# Patient Record
Sex: Female | Born: 1960 | Race: White | Hispanic: No | Marital: Single | Smoking: Never smoker
Health system: Southern US, Community
[De-identification: ages and names within clinical notes are randomized; demographics above are authoritative.]

## PROBLEM LIST (undated history)

## (undated) DIAGNOSIS — E7800 Pure hypercholesterolemia, unspecified: Secondary | ICD-10-CM

## (undated) DIAGNOSIS — R011 Cardiac murmur, unspecified: Secondary | ICD-10-CM

## (undated) DIAGNOSIS — Z98811 Dental restoration status: Secondary | ICD-10-CM

## (undated) DIAGNOSIS — N6310 Unspecified lump in the right breast, unspecified quadrant: Secondary | ICD-10-CM

## (undated) DIAGNOSIS — J45998 Other asthma: Secondary | ICD-10-CM

---

## 2009-10-21 HISTORY — PX: ABDOMINAL HYSTERECTOMY: SHX81

## 2013-03-16 DIAGNOSIS — E669 Obesity, unspecified: Secondary | ICD-10-CM | POA: Insufficient documentation

## 2013-03-16 DIAGNOSIS — E7800 Pure hypercholesterolemia, unspecified: Secondary | ICD-10-CM | POA: Insufficient documentation

## 2013-05-28 DIAGNOSIS — M25571 Pain in right ankle and joints of right foot: Secondary | ICD-10-CM | POA: Insufficient documentation

## 2013-05-28 DIAGNOSIS — M79671 Pain in right foot: Secondary | ICD-10-CM | POA: Insufficient documentation

## 2013-08-05 DIAGNOSIS — M7989 Other specified soft tissue disorders: Secondary | ICD-10-CM | POA: Insufficient documentation

## 2013-08-05 DIAGNOSIS — M2011 Hallux valgus (acquired), right foot: Secondary | ICD-10-CM | POA: Insufficient documentation

## 2013-10-25 DIAGNOSIS — Z9109 Other allergy status, other than to drugs and biological substances: Secondary | ICD-10-CM | POA: Insufficient documentation

## 2013-12-17 HISTORY — PX: COLONOSCOPY: SHX174

## 2013-12-17 LAB — HM COLONOSCOPY

## 2016-02-01 ENCOUNTER — Encounter: Payer: 59 | Attending: Family Medicine | Admitting: Dietician

## 2016-02-01 ENCOUNTER — Encounter: Payer: Self-pay | Admitting: Dietician

## 2016-02-01 VITALS — Ht 65.5 in | Wt 184.6 lb

## 2016-02-01 DIAGNOSIS — E669 Obesity, unspecified: Secondary | ICD-10-CM

## 2016-02-01 DIAGNOSIS — E785 Hyperlipidemia, unspecified: Secondary | ICD-10-CM | POA: Diagnosis not present

## 2016-02-01 DIAGNOSIS — Z0000 Encounter for general adult medical examination without abnormal findings: Secondary | ICD-10-CM | POA: Diagnosis not present

## 2016-02-01 DIAGNOSIS — J45909 Unspecified asthma, uncomplicated: Secondary | ICD-10-CM | POA: Diagnosis not present

## 2016-02-01 DIAGNOSIS — Z713 Dietary counseling and surveillance: Secondary | ICD-10-CM | POA: Insufficient documentation

## 2016-02-01 DIAGNOSIS — Z79899 Other long term (current) drug therapy: Secondary | ICD-10-CM | POA: Diagnosis not present

## 2016-02-01 DIAGNOSIS — Z882 Allergy status to sulfonamides status: Secondary | ICD-10-CM | POA: Diagnosis not present

## 2016-02-01 NOTE — Progress Notes (Signed)
  Medical Nutrition Therapy:  Appt start time: 1125 end time:  1210.   Assessment:  Primary concerns today: Angela Barajas is here today since she is gaining weight. Diabetes and high cholesterol runs in her family. Tries to manage cholesterol with supplements since Lipitor made her legs ache. Had blood work done today. Started gaining weight in November when she started working at [employer] (between 12-15 lbs). Usual body weight is 155-170 lbs.   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 Feels like she is eating on the go. Has a 45-60 minute commute. Needs to know what kind of snack foods are good to eat. Does not miss or skip meals. Tries to not eat past 8 PM. Eats out as little as possible (tries to limit preservatives and sodium). Will cook meals ahead and freeze them.   Was snacking on muffins and other snacks when she was training for her job. Trying to cut back on carbs (cereal) and snacks in the last 2 weeks. Hasn't been walking for exercise lately but used to at her old job. Also knee is bothering her (arthritis).   Tries to drink more water than she used to. Cutting back on snacks.   Preferred Learning Style:   No preference indicated   Learning Readiness:   Ready  MEDICATIONS: see list   DIETARY INTAKE:  Usual eating pattern includes 3 meals and 0-1 snacks per day.  Avoided foods include: celery, cooked cabbage  24-hr recall:  B ( AM): egg and bacon or cereal (cheerios) and milk or low carb breakfast bar  Snk ( AM): none L ( PM): salad with boiled egg, nuts, sometimes fruit or lima or pork and beans or subway (chicken and bacon 6 in sub) or Malaw[place]urkey sandwich with cheese and sometimes with chips  Snk ( PM): none  D ( PM): broiled chicken or halibut/fish with green beans or potatoes/rice or shrimp and grits Snk ( PM): ice cream popsicle sometime Beverages: coffee with splenda and creamer, diet soda, water  Usual physical activity: exercise bike 15  minutes 2 x week  Estimated energy needs: 1600 calories 180 g carbohydrates 120 g protein 44 g fat  Progress Towards Goal(s):  In progress.   Nutritional Diagnosis:  Cowles-3.3 Overweight/obesity As related to hx of excess consumption of carbohydrates and unstructured meals and snacks.  As evidenced by BMI of 30.3.    Intervention:  Nutrition counseling provided. Plan: Aim to fill half of your plate with vegetables at lunch and dinner. Have protein the size of the palm of your hands Limit starches/carbs to a quarter of your plate. Have carb and protein together at breakfast and snacks. Try [name] cereal. Recommendation for exercise is 30 minutes 5 x week (150 minutes) Look to add strength training exercise. (look into trainer/classes through [employer])  Teaching Method Utilized:  Visual Auditory Hands on  Handouts given during visit include:  MyPlate Handout  15 g CHO Snacks  Barriers to learning/adherence to lifestyle change: busy schedule  Demonstrated degree of understanding via:  Teach Back   Monitoring/Evaluation:  Dietary intake, exercise, and body weight in 1 month(s).

## 2016-02-01 NOTE — Patient Instructions (Addendum)
Aim to fill half of your plate with vegetables at lunch and dinner. Have protein the size of the palm of your hands Limit starches/carbs to a quarter of your plate. Have carb and protein together at breakfast and snacks. Try Angela Barajas cereal. Recommendation for exercise is 30 minutes 5 x week (150 minutes) Look to add strength training exercise. (look into trainer/classes through [employer])

## 2016-02-05 MED FILL — VENTOLIN HFA 90 MCG INHALER: 108 (90 BAS | 25 days supply | Qty: 18 | Fill #0

## 2016-02-05 MED FILL — DYMISTA NASAL SPRAY: 137-50 | 30 days supply | Qty: 23 | Fill #0

## 2016-02-05 MED FILL — MONTELUKAST SOD 10 MG TAB: 10 | 90 days supply | Qty: 90 | Fill #0

## 2016-03-11 ENCOUNTER — Ambulatory Visit: Payer: Self-pay | Admitting: Dietician

## 2016-03-15 DIAGNOSIS — Z1231 Encounter for screening mammogram for malignant neoplasm of breast: Secondary | ICD-10-CM | POA: Diagnosis not present

## 2016-04-15 DIAGNOSIS — N6001 Solitary cyst of right breast: Secondary | ICD-10-CM | POA: Diagnosis not present

## 2016-04-15 LAB — HM MAMMOGRAPHY

## 2016-05-09 DIAGNOSIS — R928 Other abnormal and inconclusive findings on diagnostic imaging of breast: Secondary | ICD-10-CM | POA: Diagnosis not present

## 2016-05-09 DIAGNOSIS — N6011 Diffuse cystic mastopathy of right breast: Secondary | ICD-10-CM | POA: Diagnosis not present

## 2016-06-17 DIAGNOSIS — R928 Other abnormal and inconclusive findings on diagnostic imaging of breast: Secondary | ICD-10-CM | POA: Diagnosis not present

## 2016-07-12 ENCOUNTER — Ambulatory Visit: Payer: Self-pay | Admitting: General Surgery

## 2016-07-12 DIAGNOSIS — R928 Other abnormal and inconclusive findings on diagnostic imaging of breast: Secondary | ICD-10-CM

## 2016-07-21 DIAGNOSIS — N6310 Unspecified lump in the right breast, unspecified quadrant: Secondary | ICD-10-CM

## 2016-07-21 HISTORY — DX: Unspecified lump in the right breast, unspecified quadrant: N63.10

## 2016-07-24 ENCOUNTER — Other Ambulatory Visit: Payer: Self-pay | Admitting: General Surgery

## 2016-07-24 DIAGNOSIS — R928 Other abnormal and inconclusive findings on diagnostic imaging of breast: Secondary | ICD-10-CM

## 2016-07-25 ENCOUNTER — Encounter (HOSPITAL_BASED_OUTPATIENT_CLINIC_OR_DEPARTMENT_OTHER): Payer: Self-pay | Admitting: *Deleted

## 2016-08-07 DIAGNOSIS — R928 Other abnormal and inconclusive findings on diagnostic imaging of breast: Secondary | ICD-10-CM | POA: Diagnosis not present

## 2016-08-31 MED FILL — VENTOLIN HFA 90 MCG INHALER: 108 (90 BAS | 25 days supply | Qty: 18 | Fill #1

## 2016-09-02 MED FILL — MONTELUKAST SOD 10 MG TAB: 10 | 90 days supply | Qty: 90 | Fill #1

## 2016-09-02 MED FILL — DYMISTA NASAL SPRAY: 137-50 | 30 days supply | Qty: 23 | Fill #1

## 2016-09-17 ENCOUNTER — Encounter (HOSPITAL_COMMUNITY): Payer: Self-pay | Admitting: *Deleted

## 2016-09-20 ENCOUNTER — Ambulatory Visit
Admission: RE | Admit: 2016-09-20 | Discharge: 2016-09-20 | Disposition: A | Discharge status: Home / Self Care | Payer: 59 | Source: Ambulatory Visit | Attending: General Surgery | Admitting: General Surgery

## 2016-09-20 DIAGNOSIS — N6489 Other specified disorders of breast: Secondary | ICD-10-CM | POA: Diagnosis not present

## 2016-09-20 DIAGNOSIS — R928 Other abnormal and inconclusive findings on diagnostic imaging of breast: Secondary | ICD-10-CM

## 2016-09-20 NOTE — Progress Notes (Signed)
Pt given 8 oz carton of boost breeze to drink by 430 am morning of surgery given written instruction and verbal , teach back ,  Pt voiced understanding

## 2016-09-23 ENCOUNTER — Encounter (HOSPITAL_BASED_OUTPATIENT_CLINIC_OR_DEPARTMENT_OTHER)
Admission: RE | Disposition: A | Discharge status: Home / Self Care | Payer: Self-pay | Source: Ambulatory Visit | Attending: General Surgery

## 2016-09-23 ENCOUNTER — Encounter (HOSPITAL_BASED_OUTPATIENT_CLINIC_OR_DEPARTMENT_OTHER): Payer: Self-pay

## 2016-09-23 ENCOUNTER — Ambulatory Visit (HOSPITAL_BASED_OUTPATIENT_CLINIC_OR_DEPARTMENT_OTHER): Payer: 59 | Admitting: Anesthesiology

## 2016-09-23 ENCOUNTER — Ambulatory Visit (HOSPITAL_COMMUNITY)
Admission: RE | Admit: 2016-09-23 | Discharge: 2016-09-23 | Disposition: A | Discharge status: Home / Self Care | Payer: 59 | Source: Ambulatory Visit | Attending: General Surgery | Admitting: General Surgery

## 2016-09-23 ENCOUNTER — Ambulatory Visit
Admission: RE | Admit: 2016-09-23 | Discharge: 2016-09-23 | Disposition: A | Discharge status: Home / Self Care | Payer: 59 | Source: Ambulatory Visit | Attending: General Surgery | Admitting: General Surgery

## 2016-09-23 DIAGNOSIS — Z9889 Other specified postprocedural states: Secondary | ICD-10-CM | POA: Diagnosis not present

## 2016-09-23 DIAGNOSIS — R928 Other abnormal and inconclusive findings on diagnostic imaging of breast: Secondary | ICD-10-CM

## 2016-09-23 DIAGNOSIS — Z79899 Other long term (current) drug therapy: Secondary | ICD-10-CM | POA: Diagnosis not present

## 2016-09-23 DIAGNOSIS — N6489 Other specified disorders of breast: Secondary | ICD-10-CM | POA: Diagnosis not present

## 2016-09-23 DIAGNOSIS — N6011 Diffuse cystic mastopathy of right breast: Secondary | ICD-10-CM | POA: Diagnosis not present

## 2016-09-23 DIAGNOSIS — Z882 Allergy status to sulfonamides status: Secondary | ICD-10-CM | POA: Diagnosis not present

## 2016-09-23 DIAGNOSIS — Z90710 Acquired absence of both cervix and uterus: Secondary | ICD-10-CM | POA: Insufficient documentation

## 2016-09-23 DIAGNOSIS — J45909 Unspecified asthma, uncomplicated: Secondary | ICD-10-CM | POA: Insufficient documentation

## 2016-09-23 DIAGNOSIS — Z833 Family history of diabetes mellitus: Secondary | ICD-10-CM | POA: Insufficient documentation

## 2016-09-23 DIAGNOSIS — N6310 Unspecified lump in the right breast, unspecified quadrant: Secondary | ICD-10-CM | POA: Diagnosis not present

## 2016-09-23 DIAGNOSIS — N63 Unspecified lump in unspecified breast: Secondary | ICD-10-CM | POA: Diagnosis present

## 2016-09-23 DIAGNOSIS — Z8249 Family history of ischemic heart disease and other diseases of the circulatory system: Secondary | ICD-10-CM | POA: Insufficient documentation

## 2016-09-23 HISTORY — DX: Unspecified lump in the right breast, unspecified quadrant: N63.10

## 2016-09-23 HISTORY — DX: Dental restoration status: Z98.811

## 2016-09-23 HISTORY — DX: Pure hypercholesterolemia, unspecified: E78.00

## 2016-09-23 HISTORY — PX: RADIOACTIVE SEED GUIDED EXCISIONAL BREAST BIOPSY: SHX6490

## 2016-09-23 HISTORY — DX: Cardiac murmur, unspecified: R01.1

## 2016-09-23 HISTORY — DX: Other asthma: J45.998

## 2016-09-23 SURGERY — RADIOACTIVE SEED GUIDED BREAST BIOPSY
Anesthesia: General | Site: Breast | Laterality: Right

## 2016-09-23 MED ORDER — EPHEDRINE SULFATE 50 MG/ML IJ SOLN
INTRAMUSCULAR | Status: DC | PRN
  Administered 2016-09-23: 10 mg via INTRAVENOUS

## 2016-09-23 MED ORDER — CEFAZOLIN SODIUM-DEXTROSE 2-4 GM/100ML-% IV SOLN
2.0000 g | INTRAVENOUS | Status: AC
  Administered 2016-09-23: 2 g via INTRAVENOUS

## 2016-09-23 MED ORDER — PROMETHAZINE HCL 25 MG/ML IJ SOLN
INTRAMUSCULAR | Status: AC
  Filled 2016-09-23: qty 1

## 2016-09-23 MED ORDER — LIDOCAINE HCL (CARDIAC) 20 MG/ML IV SOLN
INTRAVENOUS | Status: DC | PRN
  Administered 2016-09-23: 30 mg via INTRAVENOUS

## 2016-09-23 MED ORDER — HYDROMORPHONE HCL 1 MG/ML IJ SOLN
0.2500 mg | INTRAMUSCULAR | Status: DC | PRN
  Administered 2016-09-23: 0.5 mg via INTRAVENOUS

## 2016-09-23 MED ORDER — FENTANYL CITRATE (PF) 100 MCG/2ML IJ SOLN
INTRAMUSCULAR | Status: AC
  Filled 2016-09-23: qty 2

## 2016-09-23 MED ORDER — ACETAMINOPHEN 500 MG PO TABS
1000.0000 mg | ORAL_TABLET | ORAL | Status: AC
  Administered 2016-09-23: 1000 mg via ORAL

## 2016-09-23 MED ORDER — MIDAZOLAM HCL 2 MG/2ML IJ SOLN
INTRAMUSCULAR | Status: AC
  Filled 2016-09-23: qty 2

## 2016-09-23 MED ORDER — PHENYLEPHRINE 40 MCG/ML (10ML) SYRINGE FOR IV PUSH (FOR BLOOD PRESSURE SUPPORT)
PREFILLED_SYRINGE | INTRAVENOUS | Status: AC
  Filled 2016-09-23: qty 10

## 2016-09-23 MED ORDER — HYDROMORPHONE HCL 1 MG/ML IJ SOLN
INTRAMUSCULAR | Status: AC
  Filled 2016-09-23: qty 1

## 2016-09-23 MED ORDER — PROPOFOL 10 MG/ML IV BOLUS
INTRAVENOUS | Status: AC
  Filled 2016-09-23: qty 20

## 2016-09-23 MED ORDER — LACTATED RINGERS IV SOLN
INTRAVENOUS | Status: DC
  Administered 2016-09-23 (×2): via INTRAVENOUS

## 2016-09-23 MED ORDER — GLYCOPYRROLATE 0.2 MG/ML IV SOSY
PREFILLED_SYRINGE | INTRAVENOUS | Status: AC
  Filled 2016-09-23: qty 3

## 2016-09-23 MED ORDER — ACETAMINOPHEN 500 MG PO TABS
ORAL_TABLET | ORAL | Status: AC
  Filled 2016-09-23: qty 2

## 2016-09-23 MED ORDER — DEXAMETHASONE SODIUM PHOSPHATE 10 MG/ML IJ SOLN
INTRAMUSCULAR | Status: AC
  Filled 2016-09-23: qty 1

## 2016-09-23 MED ORDER — FENTANYL CITRATE (PF) 100 MCG/2ML IJ SOLN: 50.0000 ug | INTRAMUSCULAR | Status: DC | PRN

## 2016-09-23 MED ORDER — ONDANSETRON HCL 4 MG/2ML IJ SOLN
INTRAMUSCULAR | Status: AC
  Filled 2016-09-23: qty 2

## 2016-09-23 MED ORDER — LIDOCAINE 2% (20 MG/ML) 5 ML SYRINGE
INTRAMUSCULAR | Status: AC
  Filled 2016-09-23: qty 5

## 2016-09-23 MED ORDER — BUPIVACAINE HCL (PF) 0.25 % IJ SOLN
INTRAMUSCULAR | Status: DC | PRN
  Administered 2016-09-23: 15 mL

## 2016-09-23 MED ORDER — HYDROCODONE-ACETAMINOPHEN 10-325 MG PO TABS: 1.0000 | ORAL_TABLET | Freq: Four times a day (QID) | ORAL | 0 refills | Status: DC | PRN

## 2016-09-23 MED ORDER — EPHEDRINE 5 MG/ML INJ
INTRAVENOUS | Status: AC
  Filled 2016-09-23: qty 10

## 2016-09-23 MED ORDER — GABAPENTIN 300 MG PO CAPS
300.0000 mg | ORAL_CAPSULE | ORAL | Status: AC
  Administered 2016-09-23: 300 mg via ORAL

## 2016-09-23 MED ORDER — PROPOFOL 10 MG/ML IV BOLUS
INTRAVENOUS | Status: DC | PRN
  Administered 2016-09-23: 200 mg via INTRAVENOUS

## 2016-09-23 MED ORDER — CEFAZOLIN SODIUM-DEXTROSE 2-4 GM/100ML-% IV SOLN
INTRAVENOUS | Status: AC
  Filled 2016-09-23: qty 100

## 2016-09-23 MED ORDER — GLYCOPYRROLATE 0.2 MG/ML IJ SOLN
0.2000 mg | Freq: Once | INTRAMUSCULAR | Status: AC | PRN
  Administered 2016-09-23 (×2): 0.2 mg via INTRAVENOUS

## 2016-09-23 MED ORDER — FENTANYL CITRATE (PF) 100 MCG/2ML IJ SOLN
INTRAMUSCULAR | Status: DC | PRN
  Administered 2016-09-23: 100 ug via INTRAVENOUS

## 2016-09-23 MED ORDER — SCOPOLAMINE 1 MG/3DAYS TD PT72: 1.0000 | MEDICATED_PATCH | Freq: Once | TRANSDERMAL | Status: DC | PRN

## 2016-09-23 MED ORDER — KETOROLAC TROMETHAMINE 30 MG/ML IJ SOLN: 30.0000 mg | Freq: Once | INTRAMUSCULAR | Status: DC | PRN

## 2016-09-23 MED ORDER — PROMETHAZINE HCL 25 MG/ML IJ SOLN
6.2500 mg | INTRAMUSCULAR | Status: DC | PRN
  Administered 2016-09-23: 6.25 mg via INTRAVENOUS

## 2016-09-23 MED ORDER — MIDAZOLAM HCL 2 MG/2ML IJ SOLN: 1.0000 mg | INTRAMUSCULAR | Status: DC | PRN

## 2016-09-23 MED ORDER — PHENYLEPHRINE HCL 10 MG/ML IJ SOLN
INTRAMUSCULAR | Status: DC | PRN
  Administered 2016-09-23 (×3): 80 ug via INTRAVENOUS

## 2016-09-23 MED ORDER — GABAPENTIN 300 MG PO CAPS
ORAL_CAPSULE | ORAL | Status: AC
  Filled 2016-09-23: qty 1

## 2016-09-23 MED ORDER — DEXAMETHASONE SODIUM PHOSPHATE 4 MG/ML IJ SOLN
INTRAMUSCULAR | Status: DC | PRN
  Administered 2016-09-23: 10 mg via INTRAVENOUS

## 2016-09-23 MED ORDER — BUPIVACAINE HCL (PF) 0.25 % IJ SOLN
INTRAMUSCULAR | Status: AC
  Filled 2016-09-23: qty 30

## 2016-09-23 SURGICAL SUPPLY — 52 items
BINDER BREAST LRG (GAUZE/BANDAGES/DRESSINGS) IMPLANT
BINDER BREAST MEDIUM (GAUZE/BANDAGES/DRESSINGS) IMPLANT
BINDER BREAST XLRG (GAUZE/BANDAGES/DRESSINGS) ×2 IMPLANT
BINDER BREAST XXLRG (GAUZE/BANDAGES/DRESSINGS) IMPLANT
BLADE SURG 15 STRL LF DISP TIS (BLADE) ×1 IMPLANT
BLADE SURG 15 STRL SS (BLADE) ×1
CANISTER SUC SOCK COL 7IN (MISCELLANEOUS) IMPLANT
CANISTER SUCT 1200ML W/VALVE (MISCELLANEOUS) IMPLANT
CHLORAPREP W/TINT 26ML (MISCELLANEOUS) ×2 IMPLANT
CLIP TI WIDE RED SMALL 6 (CLIP) ×2 IMPLANT
COVER BACK TABLE 60X90IN (DRAPES) ×2 IMPLANT
COVER MAYO STAND STRL (DRAPES) ×2 IMPLANT
COVER PROBE W GEL 5X96 (DRAPES) ×2 IMPLANT
DECANTER SPIKE VIAL GLASS SM (MISCELLANEOUS) IMPLANT
DERMABOND ADVANCED (GAUZE/BANDAGES/DRESSINGS) ×1
DERMABOND ADVANCED .7 DNX12 (GAUZE/BANDAGES/DRESSINGS) ×1 IMPLANT
DEVICE DUBIN W/COMP PLATE 8390 (MISCELLANEOUS) ×2 IMPLANT
DRAPE LAPAROSCOPIC ABDOMINAL (DRAPES) ×2 IMPLANT
DRAPE UTILITY XL STRL (DRAPES) ×2 IMPLANT
DRSG TEGADERM 4X4.75 (GAUZE/BANDAGES/DRESSINGS) IMPLANT
ELECT COATED BLADE 2.86 ST (ELECTRODE) ×2 IMPLANT
ELECT REM PT RETURN 9FT ADLT (ELECTROSURGICAL) ×2
ELECTRODE REM PT RTRN 9FT ADLT (ELECTROSURGICAL) ×1 IMPLANT
GLOVE BIO SURGEON STRL SZ7 (GLOVE) ×6 IMPLANT
GLOVE BIOGEL PI IND STRL 7.5 (GLOVE) ×2 IMPLANT
GLOVE BIOGEL PI INDICATOR 7.5 (GLOVE) ×2
GOWN STRL REUS W/ TWL LRG LVL3 (GOWN DISPOSABLE) ×2 IMPLANT
GOWN STRL REUS W/TWL LRG LVL3 (GOWN DISPOSABLE) ×2
ILLUMINATOR WAVEGUIDE N/F (MISCELLANEOUS) ×2 IMPLANT
KIT MARKER MARGIN INK (KITS) ×2 IMPLANT
LIGHT WAVEGUIDE WIDE FLAT (MISCELLANEOUS) IMPLANT
NEEDLE HYPO 25X1 1.5 SAFETY (NEEDLE) ×2 IMPLANT
NS IRRIG 1000ML POUR BTL (IV SOLUTION) ×2 IMPLANT
PACK BASIN DAY SURGERY FS (CUSTOM PROCEDURE TRAY) ×2 IMPLANT
PENCIL BUTTON HOLSTER BLD 10FT (ELECTRODE) ×2 IMPLANT
SLEEVE SCD COMPRESS KNEE MED (MISCELLANEOUS) ×2 IMPLANT
SPONGE GAUZE 4X4 12PLY STER LF (GAUZE/BANDAGES/DRESSINGS) IMPLANT
SPONGE LAP 4X18 X RAY DECT (DISPOSABLE) ×2 IMPLANT
STRIP CLOSURE SKIN 1/2X4 (GAUZE/BANDAGES/DRESSINGS) ×2 IMPLANT
SUT MNCRL AB 4-0 PS2 18 (SUTURE) ×2 IMPLANT
SUT MON AB 5-0 PS2 18 (SUTURE) IMPLANT
SUT SILK 2 0 SH (SUTURE) IMPLANT
SUT VIC AB 2-0 SH 27 (SUTURE) ×1
SUT VIC AB 2-0 SH 27XBRD (SUTURE) ×1 IMPLANT
SUT VIC AB 3-0 SH 27 (SUTURE) ×1
SUT VIC AB 3-0 SH 27X BRD (SUTURE) ×1 IMPLANT
SUT VIC AB 5-0 PS2 18 (SUTURE) IMPLANT
SYR CONTROL 10ML LL (SYRINGE) ×2 IMPLANT
TOWEL OR 17X24 6PK STRL BLUE (TOWEL DISPOSABLE) ×2 IMPLANT
TOWEL OR NON WOVEN STRL DISP B (DISPOSABLE) ×2 IMPLANT
TUBE CONNECTING 20X1/4 (TUBING) IMPLANT
YANKAUER SUCT BULB TIP NO VENT (SUCTIONS) IMPLANT

## 2016-09-23 NOTE — Discharge Instructions (Signed)
[facility] °Office Phone Number [phone] ° °POST OP INSTRUCTIONS ° °Always review your discharge instruction sheet given to you by the facility where your surgery was performed. ° °IF YOU HAVE DISABILITY OR FAMILY LEAVE FORMS, YOU MUST BRING THEM TO THE OFFICE FOR PROCESSING.  DO NOT GIVE THEM TO YOUR DOCTOR. ° °1. A prescription for pain medication may be given to you upon discharge.  Take your pain medication as prescribed, if needed.  If narcotic pain medicine is not needed, then you may take acetaminophen (Tylenol), naprosyn (Alleve) or ibuprofen (Advil) as needed. °2. Take your usually prescribed medications unless otherwise directed °3. If you need a refill on your pain medication, please contact your pharmacy.  They will contact our office to request authorization.  Prescriptions will not be filled after 5pm or on week-ends. °4. You should eat very light the first 24 hours after surgery, such as soup, crackers, pudding, etc.  Resume your normal diet the day after surgery. °5. Most patients will experience some swelling and bruising in the breast.  Ice packs and a good support bra will help.  Wear the breast binder provided or a sports bra for 72 hours day and night.  After that wear a sports bra during the day until you return to the office. Swelling and bruising can take several days to resolve.  °6. It is common to experience some constipation if taking pain medication after surgery.  Increasing fluid intake and taking a stool softener will usually help or prevent this problem from occurring.  A mild laxative (Milk of Magnesia or Miralax) should be taken according to package directions if there are no bowel movements after 48 hours. °7. Unless discharge instructions indicate otherwise, you may remove your bandages 48 hours after surgery and you may shower at that time.  You may have steri-strips (small skin tapes) in place directly over the incision.  These strips should be left on the  skin for 7-10 days and will come off on their own.  If your surgeon used skin glue on the incision, you may shower in 24 hours.  The glue will flake off over the next 2-3 weeks.  Any sutures or staples will be removed at the office during your follow-up visit. °8. ACTIVITIES:  You may resume regular daily activities (gradually increasing) beginning the next day.  Wearing a good support bra or sports bra minimizes pain and swelling.  You may have sexual intercourse when it is comfortable. °a. You may drive when you no longer are taking prescription pain medication, you can comfortably wear a seatbelt, and you can safely maneuver your car and apply brakes. °b. RETURN TO WORK:  ______________________________________________________________________________________ °9. You should see your doctor in the office for a follow-up appointment approximately two weeks after your surgery.  Your doctor’s nurse will typically make your follow-up appointment when she calls you with your pathology report.  Expect your pathology report 3-4 business days after your surgery.  You may call to check if you do not hear from us after three days. °10. OTHER INSTRUCTIONS: _______________________________________________________________________________________________ _____________________________________________________________________________________________________________________________________ °_____________________________________________________________________________________________________________________________________ °_____________________________________________________________________________________________________________________________________ ° °WHEN TO CALL DR WAKEFIELD: °1. Fever over 101.0 °2. Nausea and/or vomiting. °3. Extreme swelling or bruising. °4. Continued bleeding from incision. °5. Increased pain, redness, or drainage from the incision. ° °The clinic staff is available to answer your questions during regular  business hours.  Please don’t hesitate to call and ask to speak to one of the nurses for clinical concerns.  If   you have a medical emergency, go to the nearest emergency room or call 911.  A surgeon from [facility] is always on call at the hospital. ° °For further questions, please visit [email or web address] ° ° ° °Post Anesthesia Home Care Instructions ° °Activity: °Get plenty of rest for the remainder of the day. A responsible adult should stay with you for 24 hours following the procedure.  °For the next 24 hours, DO NOT: °-Drive a car °-Operate machinery °-Drink alcoholic beverages °-Take any medication unless instructed by your physician °-Make any legal decisions or sign important papers. ° °Meals: °Start with liquid foods such as gelatin or soup. Progress to regular foods as tolerated. Avoid greasy, spicy, heavy foods. If nausea and/or vomiting occur, drink only clear liquids until the nausea and/or vomiting subsides. Call your physician if vomiting continues. ° °Special Instructions/Symptoms: °Your throat may feel dry or sore from the anesthesia or the breathing tube placed in your throat during surgery. If this causes discomfort, gargle with warm salt water. The discomfort should disappear within 24 hours. ° °If you had a scopolamine patch placed behind your ear for the management of post- operative nausea and/or vomiting: ° °1. The medication in the patch is effective for 72 hours, after which it should be removed.  Wrap patch in a tissue and discard in the trash. Wash hands thoroughly with soap and water. °2. You may remove the patch earlier than 72 hours if you experience unpleasant side effects which may include dry mouth, dizziness or visual disturbances. °3. Avoid touching the patch. Wash your hands with soap and water after contact with the patch. °  ° °

## 2016-09-23 NOTE — Anesthesia Postprocedure Evaluation (Signed)
Anesthesia Post Note  Patient: Angela Barajas  Procedure(s) Performed: Procedure(s) (LRB): RADIOACTIVE SEED GUIDED EXCISIONAL RIGHT BREAST BIOPSY (Right)  Patient location during evaluation: PACU Anesthesia Type: General Level of consciousness: awake and alert Pain management: pain level controlled Vital Signs Assessment: post-procedure vital signs reviewed and stable Respiratory status: spontaneous breathing, nonlabored ventilation, respiratory function stable and patient connected to nasal cannula oxygen Cardiovascular status: blood pressure returned to baseline and stable Postop Assessment: no signs of nausea or vomiting Anesthetic complications: no    Last Vitals:  Vitals:   09/23/16 0900 09/23/16 0915  BP: 116/72 (P) 120/65  Pulse: 69 (P) 65  Resp: 19 (P) 16  Temp:      Last Pain:  Vitals:   09/23/16 0900  TempSrc:   PainSc: 3                  Kennieth RadFitzgerald, Bucky Grigg E

## 2016-09-23 NOTE — H&P (Signed)
  Angela Barajas who has no family history and no real personal history presents after undergoing screening mm that shows possible architectural distortion in right breast, left breast is negative. she underwent mag views and this persists. on us there is a small cyst and this lesion doesnt really show up. she underwent 3d guided biopsy of this birads four lesion. that is benign. 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    Other Problems  Asthma Heart murmur Hypercholesterolemia  Past Surgical History Hysterectomy (not due to cancer) - Complete  Diagnostic Studies History  Colonoscopy within last year Mammogram within last year Pap Smear >5 years ago  Allergies Sulfur *CHEMICALS*  Medication History  Red Yeast Rice (600MG  Capsule, Oral) Active. Omega 3 (1000MG  Capsule, Oral) Active. Psyllium (0.52GM Capsule, Oral) Active. Calcium "900" w/D (Oral) Active. Cyanocobalamin (1000MCG/ML Solution, Injection) Active. Medications Reconciled  Social History  Caffeine use Carbonated beverages, Coffee, Tea. No alcohol use No drug use Tobacco use Never smoker.  Family History  Diabetes Mellitus Mother. Hypertension Father, Mother.  Pregnancy / Birth History  Gravida 0 Para 0  Review of Systems General Present- Weight Gain. Not Present- Appetite Loss, Chills, Fatigue, Fever, Night Sweats and Weight Loss. HEENT Present- Seasonal Allergies and Wears glasses/contact lenses. Not Present- Earache, Hearing Loss, Hoarseness, Nose Bleed, Oral Ulcers, Ringing in the Ears, Sinus Pain, Sore Throat, Visual Disturbances and Yellow Eyes. Respiratory Not Present- Bloody sputum, Chronic Cough, Difficulty Breathing, Snoring and Wheezing. Breast Present- Breast Mass. Not Present- Breast Pain, Nipple Discharge and Skin Changes. Cardiovascular Not Present- Chest Pain, Difficulty Breathing Lying Down, Leg Cramps,  Palpitations, Rapid Heart Rate, Shortness of Breath and Swelling of Extremities. Gastrointestinal Not Present- Abdominal Pain, Bloating, Bloody Stool, Change in Bowel Habits, Chronic diarrhea, Constipation, Difficulty Swallowing, Excessive gas, Gets full quickly at meals, Hemorrhoids, Indigestion, Nausea, Rectal Pain and Vomiting. Female Genitourinary Not Present- Frequency, Nocturia, Painful Urination, Pelvic Pain and Urgency. Musculoskeletal Not Present- Back Pain, Joint Pain, Joint Stiffness, Muscle Pain, Muscle Weakness and Swelling of Extremities. Neurological Not Present- Decreased Memory, Fainting, Headaches, Numbness, Seizures, Tingling, Tremor, Trouble walking and Weakness. Psychiatric Not Present- Anxiety, Bipolar, Change in Sleep Pattern, Depression, Fearful and Frequent crying. Endocrine Not Present- Cold Intolerance, Excessive Hunger, Hair Changes, Heat Intolerance, Hot flashes and New Diabetes. Hematology Not Present- Blood Thinners, Easy Bruising, Excessive bleeding, Gland problems, HIV and Persistent Infections.  Vitals  Weight: 184 lb Height: 66in Body Surface Area: 1.93 m Body Mass Index: 29.7 kg/m  Pulse: 81 (Regular)  BP: 124/82 (Sitting, Left Arm, Standard) Physical Exam  General Mental Status-Alert. Orientation-Oriented X3. Breast Nipples-No Discharge. Breast Lump-No Palpable Breast Mass. Lymphatic Head & Neck General Head & Neck Lymphatics: Bilateral - Description - Normal. Axillary General Axillary Region: Bilateral - Description - Normal. Note: no [place] adenopathy  Assessment & Plan  MAMMOGRAM ABNORMAL (R92.8) Story: Right breast seed guided excisional biopsy we discussed seed placement, surgery and recovery again today. she is agreeable and will proceed

## 2016-09-23 NOTE — Transfer of Care (Signed)
Immediate Anesthesia Transfer of Care Note  Patient: Angela Barajas  Procedure(s) Performed: Procedure(s): RADIOACTIVE SEED GUIDED EXCISIONAL RIGHT BREAST BIOPSY (Right)  Patient Location: PACU  Anesthesia Type:General  Level of Consciousness: sedated  Airway & Oxygen Therapy: Patient Spontanous Breathing and Patient connected to face mask oxygen  Post-op Assessment: Report given to RN and Post -op Vital signs reviewed and stable  Post vital signs: Reviewed and stable  Last Vitals:  Vitals:   09/23/16 0644  BP: 113/62  Pulse: (!) 58  Resp: 18  Temp: 36.6 C    Last Pain:  Vitals:   09/23/16 0644  TempSrc: Oral         Complications: No apparent anesthesia complications

## 2016-09-23 NOTE — Anesthesia Procedure Notes (Signed)
Procedure Name: LMA Insertion Date/Time: 09/23/2016 7:35 AM Performed by: Angela NorlanderLINKA, Angela Barajas Pre-anesthesia Checklist: Patient identified, Emergency Drugs available, Suction available, Patient being monitored and Timeout performed Patient Re-evaluated:Patient Re-evaluated prior to inductionOxygen Delivery Method: Circle system utilized Preoxygenation: Pre-oxygenation with 100% oxygen Intubation Type: IV induction Ventilation: Mask ventilation without difficulty LMA: LMA inserted LMA Size: 4.0 Number of attempts: 1 Airway Equipment and Method: Bite block Placement Confirmation: positive ETCO2 Tube secured with: Tape Dental Injury: Teeth and Oropharynx as per pre-operative assessment

## 2016-09-23 NOTE — Op Note (Addendum)
Preoperative diagnoses: Right breast mammographic lesion Postoperative diagnosis: Same as above Procedure:Rightbreast seed guided excisional biopsy Surgeon: Dr. Harden MoMatt [place] Barajas Anesthesia: Gen. Estimated blood loss: Minimal Complications: None Drains: None Specimens:Right breast tissue with paint Sponge and needle count correct at completion Disposition to recovery stable  Indications: This is a [age] yof who underwent mm with abnormality that underwent core biopsy that was discordant. We discussed excisional biopsy using seed guidance. She had seed placed prior to beginning and the mammograms were in the room.    Procedure: After informed consent was obtained she was then taken to the operating room. She was given cefazolin. Sequential compression devices were on her legs. She was placed under general anesthesia without complication. Her rightbreast was then prepped and draped in the standard sterile surgical fashion. A surgical timeout was then performed.  I located the radioactive seed with the neoprobe. I infiltrated marcaine in the area of the seed as well as around the areola. I made an incision on areolar border in an effort to hide her scar. I tunneled to the lesion with the lighted retractors. I then used the neoprobe to guide the excision of the seed and surrounding tissue. This was confirmed by the neoprobe. This was then taken for mammogram which confirmed removal of the seed and the clip. This was confirmed by radiology. This was then sent to pathology. Hemostasis was observed.I closed the breast tissue with a 2-0 Vicryl. The dermis was closed with 3-0 Vicryl and the skin with 5-0 Monocryl.Dermabond and steristrips were placed on the incision. A breast binder was placed. She was transferred to recovery stable

## 2016-09-23 NOTE — Anesthesia Preprocedure Evaluation (Addendum)
Anesthesia Evaluation  Patient identified by MRN, date of birth, ID band Patient awake    Reviewed: Allergy & Precautions, NPO status , Patient's Chart, lab work & pertinent test results  Airway Mallampati: II  TM Distance: >3 FB Neck ROM: Full    Dental  (+) Dental Advisory Given   Pulmonary asthma ,    breath sounds clear to auscultation       Cardiovascular negative cardio ROS   Rhythm:Regular Rate:Normal     Neuro/Psych negative neurological ROS     GI/Hepatic negative GI ROS, Neg liver ROS,   Endo/Other  negative endocrine ROS  Renal/GU negative Renal ROS     Musculoskeletal   Abdominal   Peds  Hematology negative hematology ROS (+)   Anesthesia Other Findings   Reproductive/Obstetrics                            Anesthesia Physical Anesthesia Plan  ASA: II  Anesthesia Plan: General   Post-op Pain Management:    Induction: Intravenous  Airway Management Planned: LMA  Additional Equipment:   Intra-op Plan:   Post-operative Plan: Extubation in OR  Informed Consent: I have reviewed the patients History and Physical, chart, labs and discussed the procedure including the risks, benefits and alternatives for the proposed anesthesia with the patient or authorized representative who has indicated his/her understanding and acceptance.   Dental advisory given  Plan Discussed with: CRNA  Anesthesia Plan Comments:        Anesthesia Quick Evaluation

## 2016-09-24 ENCOUNTER — Encounter (HOSPITAL_BASED_OUTPATIENT_CLINIC_OR_DEPARTMENT_OTHER): Payer: Self-pay | Admitting: General Surgery

## 2016-11-12 ENCOUNTER — Ambulatory Visit: Payer: Self-pay | Admitting: Osteopathic Medicine

## 2016-11-28 ENCOUNTER — Ambulatory Visit (INDEPENDENT_AMBULATORY_CARE_PROVIDER_SITE_OTHER): Payer: 59 | Admitting: Osteopathic Medicine

## 2016-11-28 VITALS — BP 132/73 | HR 57 | Temp 97.8°F | Wt 184.0 lb

## 2016-11-28 DIAGNOSIS — J4520 Mild intermittent asthma, uncomplicated: Secondary | ICD-10-CM

## 2016-11-28 DIAGNOSIS — Z1239 Encounter for other screening for malignant neoplasm of breast: Secondary | ICD-10-CM

## 2016-11-28 DIAGNOSIS — R011 Cardiac murmur, unspecified: Secondary | ICD-10-CM

## 2016-11-28 DIAGNOSIS — Z0000 Encounter for general adult medical examination without abnormal findings: Secondary | ICD-10-CM

## 2016-11-28 DIAGNOSIS — M25461 Effusion, right knee: Secondary | ICD-10-CM

## 2016-11-28 DIAGNOSIS — J302 Other seasonal allergic rhinitis: Secondary | ICD-10-CM

## 2016-11-28 DIAGNOSIS — J329 Chronic sinusitis, unspecified: Secondary | ICD-10-CM | POA: Diagnosis not present

## 2016-11-28 DIAGNOSIS — Z23 Encounter for immunization: Secondary | ICD-10-CM | POA: Diagnosis not present

## 2016-11-28 MED ORDER — AZELASTINE-FLUTICASONE 137-50 MCG/ACT NA SUSP: 1.0000 | Freq: Two times a day (BID) | NASAL | 11 refills | Status: AC

## 2016-11-28 MED ORDER — ZOSTER VAC RECOMB ADJUVANTED 50 MCG/0.5ML IM SUSR: 0.5000 mL | Freq: Once | INTRAMUSCULAR | 1 refills | Status: AC

## 2016-11-28 MED ORDER — ALBUTEROL SULFATE HFA 108 (90 BASE) MCG/ACT IN AERS: 1.0000 | INHALATION_SPRAY | Freq: Four times a day (QID) | RESPIRATORY_TRACT | 5 refills | Status: AC | PRN

## 2016-11-28 MED ORDER — AMOXICILLIN-POT CLAVULANATE 875-125 MG PO TABS: 1.0000 | ORAL_TABLET | Freq: Two times a day (BID) | ORAL | 0 refills | Status: DC

## 2016-11-28 MED FILL — VENTOLIN HFA 90 MCG INHALER: 108 (90 BAS | 25 days supply | Qty: 18 | Fill #0

## 2016-11-28 MED FILL — DYMISTA NASAL SPRAY: 137-50 | 30 days supply | Qty: 23 | Fill #0

## 2016-11-28 MED FILL — AMOX-CLAV 875-125 MG TABLET: 875-125 | 7 days supply | Qty: 14 | Fill #0

## 2016-11-28 NOTE — Progress Notes (Signed)
HPI: Angela Barajas is a 56 y.o. female  who presents to [facility] today, 11/28/16,  for chief complaint of:  Chief Complaint  Patient presents with  . Establish Care    Asthma: Stable on current medications.  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 She thinks may be aggravating the R knee. Feels like has to pop, hurts on the lateral side.   Breast: Due for follow-up mammogram  Sinus drainage: tried Mucinex and other OTC medications since 09/2016. Sinus pain/pressure has persisted. No known allergen exposures.  Right knee pain: No instability/fall, has noted some swelling intermittently better/worse.  Past medical, surgical, social and family history reviewed: There are no active problems to display for this patient.  Past Surgical History:  Procedure Laterality Date  . ABDOMINAL HYSTERECTOMY  2011   complete  . COLONOSCOPY  12/17/2013  . RADIOACTIVE SEED GUIDED EXCISIONAL BREAST BIOPSY Right 09/23/2016   Procedure: RADIOACTIVE SEED GUIDED EXCISIONAL RIGHT BREAST BIOPSY;  Surgeon: Emelia LoronMatthew Wakefield, MD;  Location: [facility];  Service: General;  Laterality: Right;   Social History  Substance Use Topics  . Smoking status: Never Smoker  . Smokeless tobacco: Never Used  . Alcohol use No   No family history on file.   Current medication list and allergy/intolerance information reviewed:   Current Outpatient Prescriptions  Medication Sig Dispense Refill  . acetaminophen (TYLENOL) 325 MG tablet Take 650 mg by mouth every 6 (six) hours as needed.    Marland Kitchen. albuterol (PROVENTIL HFA;VENTOLIN HFA) 108 (90 Base) MCG/ACT inhaler Inhale into the lungs every 6 (six) hours as needed for wheezing or shortness of breath.    Marland Kitchen. GARLIC PO Take by mouth.    Marland Kitchen. HYDROcodone-acetaminophen (NORCO) 10-325 MG tablet Take 1 tablet by mouth every 6 (six) hours as needed. 10 tablet 0  . montelukast  (SINGULAIR) 10 MG tablet Take 10 mg by mouth at bedtime.    . Multiple Vitamin (MULTIVITAMIN) tablet Take 1 tablet by mouth daily.     No current facility-administered medications for this visit.    Allergies  Allergen Reactions  . Sulfa Antibiotics Shortness Of Breath  . Adhesive [Tape] Rash      Review of Systems:  Constitutional:  No  fever, no chills, +recent illness, No unintentional weight changes. No significant fatigue.   HEENT: No  headache, no vision change, no hearing change, No sore throat, +sinus pressure  Cardiac: No  chest pain, No  pressure, No palpitations, No  Orthopnea  Respiratory:  No  shortness of breath. No  Cough  Gastrointestinal: No  abdominal pain, No  nausea,  Musculoskeletal: +new myalgia/arthralgia  Skin: No  Rash  Neurologic: No  weakness, No  dizziness  Psychiatric: No  concerns with depression, No  concerns with anxiety, No sleep problems, No mood problems  Exam:  BP 132/73 (BP Location: Right Arm, Patient Position: Sitting, Cuff Size: Normal)   Pulse (!) 57   Temp 97.8 F (36.6 C) (Oral)   Wt 184 lb (83.5 kg)   SpO2 100%   BMI 31.10 kg/m   Constitutional: VS see above. General Appearance: alert, well-developed, well-nourished, NAD  Eyes: Normal lids and conjunctive, non-icteric sclera  Ears, Nose, Mouth, Throat: MMM, Normal external inspection ears/nares/mouth/lips/gums.   Neck: No masses, trachea midline. No thyroid enlargement. No tenderness/mass appreciated. No lymphadenopathy  Respiratory: Normal respiratory effort. no wheeze, no rhonchi, no rales  Cardiovascular: S1/S2  normal, +murmur, no rub/gallop auscultated. RRR. No lower extremity edema.   Gastrointestinal: Nontender, no masses. No hepatomegaly, no splenomegaly. No hernia appreciated. Bowel sounds normal. Rectal exam deferred.   Musculoskeletal: Gait normal. No clubbing/cyanosis of digits. Mild effusion right knee  Neurological: Normal balance/coordination. No  tremor. No cranial nerve deficit on limited exam. Motor and sensation intact and symmetric. Cerebellar reflexes intact.   Skin: warm, dry, intact. No rash/ulcer  Psychiatric: Normal judgment/insight. Normal mood and affect. Oriented x3.     ASSESSMENT/PLAN:   Mild intermittent asthma without complication - Plan: albuterol (PROVENTIL HFA;VENTOLIN HFA) 108 (90 Base) MCG/ACT inhaler  Screening for breast cancer - Plan: MM DIGITAL SCREENING BILATERAL  Cardiac murmur, previously undiagnosed - Plan: ECHOCARDIOGRAM COMPLETE  Effusion of right knee  Chronic seasonal allergic rhinitis due to other allergen - Plan: Azelastine-Fluticasone 137-50 MCG/ACT SUSP  Annual physical exam - Plan: CBC with Differential/Platelet, COMPLETE METABOLIC PANEL WITH GFR, Lipid panel, TSH, VITAMIN D 25 Hydroxy (Vit-D Deficiency, Fractures), Hemoglobin A1c  Need for shingles vaccine - Plan: Zoster Vac Recomb Adjuvanted (SHINGRIX) 50 MCG SUSR  Sinusitis, unspecified chronicity, unspecified location - Plan: amoxicillin-clavulanate (AUGMENTIN) 875-125 MG tablet      Visit summary with medication list and pertinent instructions was printed for patient to review. All questions at time of visit were answered - patient instructed to contact office with any additional concerns. ER/RTC precautions were reviewed with the patient. Follow-up plan: Return in about 6 months (around 05/28/2017) for LABS/FOLLOW-UP sooner if needed. Note: Labs added for future visit and annual exam, annual exam was not the billed at this time   Total time spent face-to-face: 30 minutes, greater than 50% spent counseling/coordinating care

## 2016-11-28 NOTE — Progress Notes (Signed)
Pt here to establish care.

## 2016-12-15 ENCOUNTER — Encounter: Payer: Self-pay | Admitting: Osteopathic Medicine

## 2017-01-03 ENCOUNTER — Ambulatory Visit (HOSPITAL_COMMUNITY): Payer: 59 | Attending: Cardiology

## 2017-01-03 ENCOUNTER — Other Ambulatory Visit: Payer: Self-pay

## 2017-01-03 ENCOUNTER — Encounter (INDEPENDENT_AMBULATORY_CARE_PROVIDER_SITE_OTHER): Payer: Self-pay

## 2017-01-03 DIAGNOSIS — R011 Cardiac murmur, unspecified: Secondary | ICD-10-CM | POA: Insufficient documentation

## 2017-01-03 MED FILL — SHINGRIX VIAL KIT: 50 | 30 days supply | Qty: 1 | Fill #0

## 2017-01-09 ENCOUNTER — Encounter: Payer: Self-pay | Admitting: Physician Assistant

## 2017-01-09 ENCOUNTER — Encounter: Payer: Self-pay | Admitting: Osteopathic Medicine

## 2017-01-09 LAB — MEASLES/MUMPS/RUBELLA IMMUNITY
Measles Ab IgM, IFA: NEGATIVE
Mumps IgG: NEGATIVE
Rubella Antibodies, IGG: POSITIVE

## 2017-01-09 LAB — HEPATITIS B SURFACE ANTIBODY,QUALITATIVE: HEP B SURFACE AB, QUAL: POSITIVE

## 2017-01-10 ENCOUNTER — Ambulatory Visit (INDEPENDENT_AMBULATORY_CARE_PROVIDER_SITE_OTHER): Payer: 59 | Admitting: Family Medicine

## 2017-01-10 ENCOUNTER — Ambulatory Visit (INDEPENDENT_AMBULATORY_CARE_PROVIDER_SITE_OTHER): Payer: 59

## 2017-01-10 ENCOUNTER — Encounter: Payer: Self-pay | Admitting: Family Medicine

## 2017-01-10 DIAGNOSIS — M1711 Unilateral primary osteoarthritis, right knee: Secondary | ICD-10-CM | POA: Diagnosis not present

## 2017-01-10 DIAGNOSIS — G8929 Other chronic pain: Secondary | ICD-10-CM

## 2017-01-10 DIAGNOSIS — M76892 Other specified enthesopathies of left lower limb, excluding foot: Secondary | ICD-10-CM

## 2017-01-10 DIAGNOSIS — M1710 Unilateral primary osteoarthritis, unspecified knee: Secondary | ICD-10-CM | POA: Diagnosis not present

## 2017-01-10 DIAGNOSIS — M25561 Pain in right knee: Secondary | ICD-10-CM

## 2017-01-10 DIAGNOSIS — M1712 Unilateral primary osteoarthritis, left knee: Secondary | ICD-10-CM | POA: Diagnosis not present

## 2017-01-10 DIAGNOSIS — M76891 Other specified enthesopathies of right lower limb, excluding foot: Secondary | ICD-10-CM

## 2017-01-10 DIAGNOSIS — Z0000 Encounter for general adult medical examination without abnormal findings: Secondary | ICD-10-CM | POA: Diagnosis not present

## 2017-01-10 LAB — CBC WITH DIFFERENTIAL/PLATELET
BASOS ABS: 0 {cells}/uL (ref 0–200)
BASOS PCT: 0 %
EOS ABS: 107 {cells}/uL (ref 15–500)
Eosinophils Relative: 1 %
HCT: 41 % (ref 35.0–45.0)
HEMOGLOBIN: 13.4 g/dL (ref 11.7–15.5)
LYMPHS ABS: 2461 {cells}/uL (ref 850–3900)
Lymphocytes Relative: 23 %
MCH: 29.1 pg (ref 27.0–33.0)
MCHC: 32.7 g/dL (ref 32.0–36.0)
MCV: 89.1 fL (ref 80.0–100.0)
MONO ABS: 1070 {cells}/uL — AB (ref 200–950)
MPV: 10.1 fL (ref 7.5–12.5)
Monocytes Relative: 10 %
NEUTROS ABS: 7062 {cells}/uL (ref 1500–7800)
Neutrophils Relative %: 66 %
Platelets: 320 10*3/uL (ref 140–400)
RBC: 4.6 MIL/uL (ref 3.80–5.10)
RDW: 13.8 % (ref 11.0–15.0)
WBC: 10.7 10*3/uL (ref 3.8–10.8)

## 2017-01-10 MED ORDER — DICLOFENAC SODIUM 1 % TD GEL: 4.0000 g | Freq: Four times a day (QID) | TRANSDERMAL | 11 refills | Status: DC

## 2017-01-10 MED FILL — DICLOFENAC SODIUM 1% GEL: 1 | 6 days supply | Qty: 100 | Fill #0

## 2017-01-10 NOTE — Patient Instructions (Signed)
Thank you for coming in today. I think you may have a degenerative meniscus injury in your knee.  Use voltaren gel up to 4x daily.  Recheck in 4 weeks.  Return sooner if needed.  I recommend that you use an exercise bike most of the day.    Meniscus Tear A meniscus tear is a knee injury in which a piece of the meniscus is torn. The meniscus is a thick, rubbery, wedge-shaped cartilage in the knee. Two menisci are located in each knee. They sit between the upper bone (femur) and lower bone (tibia) that make up the knee joint. Each meniscus acts as a shock absorber for the knee. A torn meniscus is one of the most common types of knee injuries. This injury can range from mild to severe. Surgery may be needed for a severe tear. What are the causes? This injury may be caused by any squatting, twisting, or pivoting movement. Sports-related injuries are the most common cause. These often occur from:  Running and stopping suddenly.  Changing direction.  Being tackled or knocked off your feet. As people get older, their meniscus gets thinner and weaker. In these people, tears can happen more easily, such as from climbing stairs. What increases the risk? This injury is more likely to happen to:  People who play contact sports.  Males.  People who are 7230?55 years of age. What are the signs or symptoms? Symptoms of this injury include:  Knee pain, especially at the side of the knee joint. You may feel pain when the injury occurs, or you may only hear a pop and feel pain later.  A feeling that your knee is clicking, catching, locking, or giving way.  Not being able to fully bend or extend your knee.  Bruising or swelling in your knee. How is this diagnosed? This injury may be diagnosed based on your symptoms and a physical exam. The physical exam may include:  Moving your knee in different ways.  Feeling for tenderness.  Listening for a clicking sound.  Checking if your knee locks or  catches. You may also have tests, such as:  X-rays.  MRI.  A procedure to look inside your knee with a narrow surgical telescope (arthroscopy). You may be referred to a knee specialist (orthopedic surgeon). How is this treated? Treatment for this injury depends on the severity of the tear. Treatment for a mild tear may include:  Rest.  Medicine to reduce pain and swelling. This is usually a nonsteroidal anti-inflammatory drug (NSAID).  A knee brace or an elastic sleeve or wrap.  Using crutches or a walker to keep weight off your knee and to help you walk.  Exercises to strengthen your knee (physical therapy). You may need surgery if you have a severe tear or if other treatments are not working. Follow these instructions at home: Managing pain and swelling   Take over-the-counter and prescription medicines only as told by your health care provider.  If directed, apply ice to the injured area:  Put ice in a plastic bag.  Place a towel between your skin and the bag.  Leave the ice on for 20 minutes, 2-3 times per day.  Raise (elevate) the injured area above the level of your heart while you are sitting or lying down. Activity   Do not use the injured limb to support your body weight until your health care provider says that you can. Use crutches or a walker as told by your health care provider.  Return to your normal activities as told by your health care provider. Ask your health care provider what activities are safe for you.  Perform range-of-motion exercises only as told by your health care provider.  Begin doing exercises to strengthen your knee and leg muscles only as told by your health care provider. After you recover, your health care provider may recommend these exercises to help prevent another injury. General instructions   Use a knee brace or elastic wrap as told by your health care provider.  Keep all follow-up visits as told by your health care provider.  This is important. Contact a health care provider if:  You have a fever.  Your knee becomes red, tender, or swollen.  Your pain medicine is not helping.  Your symptoms get worse or do not improve after 2 weeks of home care. This information is not intended to replace advice given to you by your health care provider. Make sure you discuss any questions you have with your health care provider. Document Released: 12/28/2002 Document Revised: 03/14/2016 Document Reviewed: 01/30/2015 Elsevier Interactive Patient Education  2017 [employer].

## 2017-01-10 NOTE — Progress Notes (Signed)
Subjective:    I'm seeing this patient as a consultation for:  Angela Nielsen, DO   CC: Right Knee Pain  HPI: Patient notes a several year history of right lateral knee pain. She notes the pain has been worsening recently without injury. She denies any radiating pain weakness or numbness. She notes pain is at the lateral joint line along the lateral aspect of the patella tendon. She denies locking or catching but does note occasionally her knee will give way. The pain is moderate and it interferes with her ability to exercise and hike which she enjoys doing. She has tried some over-the-counter medicines which have helped a little. Additionally she's tried what sounds like a patellar strap which has helped only a little as well.  Past medical history, Surgical history, Family history not pertinant except as noted below, Social history, Allergies, and medications have been entered into the medical record, reviewed, and no changes needed.   Review of Systems: No headache, visual changes, nausea, vomiting, diarrhea, constipation, dizziness, abdominal pain, skin rash, fevers, chills, night sweats, weight loss, swollen lymph nodes, body aches, joint swelling, muscle aches, chest pain, shortness of breath, mood changes, visual or auditory hallucinations.   Objective:    Vitals:   01/10/17 0840  BP: 134/76  Pulse: 86   General: Well Developed, well nourished, and in no acute distress.  Neuro/Psych: Alert and oriented x3, extra-ocular muscles intact, able to move all 4 extremities, sensation grossly intact. Skin: Warm and dry, no rashes noted.  Respiratory: Not using accessory muscles, speaking in full sentences, trachea midline.  Cardiovascular: Pulses palpable, no extremity edema. Abdomen: Does not appear distended. MSK: Right knee is unremarkable appearing without effusion. Tender palpation lateral joint line. Tender palpation mildly patella without any retropatellar squeak on  extension. No retropatellar crepitations on extension. Stable ligamentous exam. Positive medial McMurray's test. Intact flexion and extension strength.  No results found for this or any previous visit (from the past 24 hour(s)). Dg Knee 1-2 Views Left  Result Date: 01/10/2017 CLINICAL DATA:  Chronic right knee no injury EXAM: LEFT KNEE - 1-2 VIEW; RIGHT KNEE - COMPLETE 4+ VIEW COMPARISON:  None FINDINGS: RIGHT knee: Osseous demineralization. Mild diffuse joint space narrowing. Small patellar spurs at quadriceps and patellar tendon insertions. No acute fracture, dislocation or bone destruction. No knee joint effusion. LEFT knee: Joint space narrowing with minimal medial compartment spur formation. No acute fracture, dislocation or bone destruction grossly evident on single AP view. IMPRESSION: Mild degenerative changes of the knees. Electronically Signed   By: Ulyses Southward M.D.   On: 01/10/2017 09:19   Dg Knee Complete 4 Views Right  Result Date: 01/10/2017 CLINICAL DATA:  Chronic right knee no injury EXAM: LEFT KNEE - 1-2 VIEW; RIGHT KNEE - COMPLETE 4+ VIEW COMPARISON:  None FINDINGS: RIGHT knee: Osseous demineralization. Mild diffuse joint space narrowing. Small patellar spurs at quadriceps and patellar tendon insertions. No acute fracture, dislocation or bone destruction. No knee joint effusion. LEFT knee: Joint space narrowing with minimal medial compartment spur formation. No acute fracture, dislocation or bone destruction grossly evident on single AP view. IMPRESSION: Mild degenerative changes of the knees. Electronically Signed   By: Ulyses Southward M.D.   On: 01/10/2017 09:19    Impression and Recommendations:    Assessment and Plan: 56 y.o. female with  Right knee pain concerning for degenerative meniscus injury. Mild arthritis present. We discussed options. Plan for home exercises focusing on quadriceps strengthening as well as diclofenac  gel. Patient will return in 4 weeks. If not better  will consider injection versus MRI.Marland Kitchen.   Discussed warning signs or symptoms. Please see discharge instructions. Patient expresses understanding.

## 2017-01-11 LAB — COMPLETE METABOLIC PANEL WITH GFR
ALBUMIN: 4.1 g/dL (ref 3.6–5.1)
ALK PHOS: 53 U/L (ref 33–130)
ALT: 31 U/L — AB (ref 6–29)
AST: 23 U/L (ref 10–35)
BILIRUBIN TOTAL: 0.6 mg/dL (ref 0.2–1.2)
BUN: 14 mg/dL (ref 7–25)
CO2: 29 mmol/L (ref 20–31)
CREATININE: 0.85 mg/dL (ref 0.50–1.05)
Calcium: 8.7 mg/dL (ref 8.6–10.4)
Chloride: 107 mmol/L (ref 98–110)
GFR, EST NON AFRICAN AMERICAN: 78 mL/min (ref >=60)
GLUCOSE: 87 mg/dL (ref 65–99)
Potassium: 4.4 mmol/L (ref 3.5–5.3)
Sodium: 144 mmol/L (ref 135–146)
TOTAL PROTEIN: 6.7 g/dL (ref 6.1–8.1)

## 2017-01-11 LAB — LIPID PANEL
CHOL/HDL RATIO: 3.6 ratio (ref <5.0)
CHOLESTEROL: 235 mg/dL — AB (ref <200)
HDL: 66 mg/dL (ref >50)
LDL CALC: 148 mg/dL — AB (ref <100)
Triglycerides: 104 mg/dL (ref <150)
VLDL: 21 mg/dL (ref <30)

## 2017-01-11 LAB — TSH: TSH: 2.39 m[IU]/L

## 2017-01-11 LAB — VITAMIN D 25 HYDROXY (VIT D DEFICIENCY, FRACTURES): VIT D 25 HYDROXY: 20 ng/mL — AB (ref 30–100)

## 2017-01-11 LAB — HEMOGLOBIN A1C
HEMOGLOBIN A1C: 5.5 % (ref <5.7)
Mean Plasma Glucose: 111 mg/dL

## 2017-02-06 ENCOUNTER — Ambulatory Visit: Payer: 59 | Admitting: Family Medicine

## 2017-03-18 MED FILL — DICLOFENAC SODIUM 1% GEL: 1 | 6 days supply | Qty: 100 | Fill #1

## 2017-03-18 MED FILL — VENTOLIN HFA 90 MCG INHALER: 108 (90 BAS | 25 days supply | Qty: 18 | Fill #1

## 2017-04-30 MED FILL — SHINGRIX 50 MCG SUS: 50 | 30 days supply | Qty: 1 | Fill #1

## 2017-05-28 ENCOUNTER — Ambulatory Visit (INDEPENDENT_AMBULATORY_CARE_PROVIDER_SITE_OTHER): Payer: 59 | Admitting: Osteopathic Medicine

## 2017-05-28 ENCOUNTER — Encounter: Payer: Self-pay | Admitting: Osteopathic Medicine

## 2017-05-28 VITALS — BP 111/74 | HR 64 | Resp 16 | Wt 188.0 lb

## 2017-05-28 DIAGNOSIS — J329 Chronic sinusitis, unspecified: Secondary | ICD-10-CM | POA: Diagnosis not present

## 2017-05-28 MED ORDER — MONTELUKAST SODIUM 10 MG PO TABS: 10.0000 mg | ORAL_TABLET | Freq: Every day | ORAL | 1 refills | Status: AC

## 2017-05-28 MED ORDER — METHYLPREDNISOLONE 4 MG PO TBPK: ORAL_TABLET | ORAL | 0 refills | Status: AC

## 2017-05-28 MED ORDER — AMOXICILLIN-POT CLAVULANATE 875-125 MG PO TABS: 1.0000 | ORAL_TABLET | Freq: Two times a day (BID) | ORAL | 0 refills | Status: AC

## 2017-05-28 MED FILL — MONTELUKAST SOD 10 MG TAB: 10 | 90 days supply | Qty: 90 | Fill #0

## 2017-05-28 MED FILL — AMOX-CLAV 875-125 MG TABLET: 875-125 | 7 days supply | Qty: 14 | Fill #0

## 2017-05-28 MED FILL — METHYLPREDNISOLONE 4 MG TAB: 4 | 6 days supply | Qty: 21 | Fill #0

## 2017-05-28 NOTE — Progress Notes (Signed)
HPI: Angela Barajas is a 56 y.o. female  who presents to [facility] today, 05/28/17,  for chief complaint of:  Chief Complaint  Patient presents with  . Follow-up  . Ear Fullness   Cough and sinus/ear pressure for about 3-4 weeks. Not taking Singulair. She tried Mucinex and other OTC medications since 09/2016. Sinus pain/pressure has persisted. No known allergen exposures. At last visit, we initiated Azelastine-Fluticasone 137-50 MCG/ACT SUSP, amoxicillin-clavulanate (AUGMENTIN) 875-125 MG tablet. She has been taking over-the-counter Sudafed as well.    Past medical, surgical, social and family history reviewed: Patient Active Problem List   Diagnosis Date Noted  . Right knee pain 01/10/2017  . Arthritis of knee 01/10/2017  . Environmental allergies 10/25/2013  . Hallux valgus of right foot 08/05/2013  . Soft tissue mass 08/05/2013  . Right ankle pain 05/28/2013  . Right foot pain 05/28/2013  . Hypercholesterolemia 03/16/2013  . Obesity (BMI 30.0-34.9) 03/16/2013   Past Surgical History:  Procedure Laterality Date  . ABDOMINAL HYSTERECTOMY  2011   complete  . COLONOSCOPY  12/17/2013  . RADIOACTIVE SEED GUIDED EXCISIONAL BREAST BIOPSY Right 09/23/2016   Procedure: RADIOACTIVE SEED GUIDED EXCISIONAL RIGHT BREAST BIOPSY;  Surgeon: Emelia Loron, MD;  Location: [facility];  Service: General;  Laterality: Right;   Social History  Substance Use Topics  . Smoking status: Never Smoker  . Smokeless tobacco: Never Used  . Alcohol use No   No family history on file.   Current medication list and allergy/intolerance information reviewed:   Current Outpatient Prescriptions  Medication Sig Dispense Refill  . acetaminophen (TYLENOL) 325 MG tablet Take 650 mg by mouth every 6 (six) hours as needed.    Marland Kitchen albuterol (PROVENTIL HFA;VENTOLIN HFA) 108 (90 Base) MCG/ACT inhaler Inhale 1-2 puffs into the lungs every 6 (six) hours as needed for  wheezing or shortness of breath. 18 g 5  . Azelastine-Fluticasone 137-50 MCG/ACT SUSP Place 1 spray into the nose 2 (two) times daily. 23 g 11  . diclofenac sodium (VOLTAREN) 1 % GEL Apply 4 g topically 4 (four) times daily. To affected joint. 100 g 11  . GARLIC PO Take by mouth.    Marland Kitchen HYDROcodone-acetaminophen (NORCO) 10-325 MG tablet Take 1 tablet by mouth every 6 (six) hours as needed. 10 tablet 0  . montelukast (SINGULAIR) 10 MG tablet Take 10 mg by mouth at bedtime.    . Multiple Vitamin (MULTIVITAMIN) tablet Take 1 tablet by mouth daily.     No current facility-administered medications for this visit.    Allergies  Allergen Reactions  . Sulfa Antibiotics Shortness Of Breath  . Adhesive [Tape] Rash      Review of Systems:  Constitutional:  No  fever, no chills, +recent illness, No unintentional weight changes. No significant fatigue.   HEENT: No  headache, no vision change, no hearing change, No sore throat, +sinus pressure  Cardiac: No  chest pain, No  pressure, No palpitations, No  Orthopnea  Respiratory:  No  shortness of breath. No  Cough  Gastrointestinal: No  abdominal pain, No  nausea,  Musculoskeletal: +new myalgia/arthralgia  Skin: No  Rash  Neurologic: No  weakness, No  dizziness  Psychiatric: No  concerns with depression, No  concerns with anxiety, No sleep problems, No mood problems  Exam:  BP 111/74   Pulse 64   Resp 16   Wt 188 lb (85.3 kg)   BMI 31.77 kg/m   Constitutional: VS see above.  General Appearance: alert, well-developed, well-nourished, NAD  Eyes: Normal lids and conjunctive, non-icteric sclera  Ears, Nose, Mouth, Throat: MMM, Normal external inspection ears/nares/mouth/lips/gums. TM normal bilaterally with cloudy but non-purulent fluid behind TM  Neck: No masses, trachea midline. No thyroid enlargement. No tenderness/mass appreciated. No lymphadenopathy  Respiratory: Normal respiratory effort. no wheeze, no rhonchi, no  rales  Cardiovascular: S1/S2 normal, +murmur, no rub/gallop auscultated. RRR. No lower extremity edema.   Musculoskeletal: Gait normal.   Skin: warm, dry, intact. No rash/ulcer  Psychiatric: Normal judgment/insight. Normal mood and affect. Oriented x3.     ASSESSMENT/PLAN:   Sinusitis, unspecified chronicity, unspecified location   Patient Instructions  Plan:  Try antibiotics  Continue nasal spray  Restart Singulair   Can try adding Claritin or Zyrtec or Allegra   If no better, will need to consider follow-up with ENT      Visit summary with medication list and pertinent instructions was printed for patient to review. All questions at time of visit were answered - patient instructed to contact office with any additional concerns. ER/RTC precautions were reviewed with the patient. Follow-up plan: Return for ANNUAL PHYSICAL 12/2017 when due .

## 2017-05-28 NOTE — Patient Instructions (Signed)
Plan:  Try antibiotics  Continue nasal spray  Restart Singulair   Can try adding Claritin or Zyrtec or Allegra   If no better, will need to consider follow-up with ENT

## 2017-06-11 ENCOUNTER — Ambulatory Visit (INDEPENDENT_AMBULATORY_CARE_PROVIDER_SITE_OTHER): Payer: 59

## 2017-06-11 DIAGNOSIS — Z1231 Encounter for screening mammogram for malignant neoplasm of breast: Secondary | ICD-10-CM | POA: Diagnosis not present

## 2017-06-16 ENCOUNTER — Institutional Professional Consult (permissible substitution): Payer: Self-pay | Admitting: Family Medicine

## 2017-07-23 MED FILL — DYMISTA NASAL SPRAY: 137-50 | 30 days supply | Qty: 23 | Fill #1

## 2017-07-23 MED FILL — VENTOLIN HFA 90 MCG INHALER: 108 (90 BAS | 25 days supply | Qty: 18 | Fill #2 | Status: TO

## 2017-12-16 ENCOUNTER — Telehealth: Payer: Self-pay

## 2017-12-16 NOTE — Telephone Encounter (Signed)
Pt called requesting medication refill for diclofenac gel. Requests rx to be sent to [facility] @ [phone] ([address] in [place]). Thanks.

## 2017-12-17 MED ORDER — DICLOFENAC SODIUM 1 % TD GEL: 4.0000 g | Freq: Four times a day (QID) | TRANSDERMAL | 11 refills | Status: AC

## 2017-12-17 NOTE — Telephone Encounter (Signed)
Done

## 2017-12-17 NOTE — Telephone Encounter (Signed)
Left a brief vm msg for pt regarding medication refill sent to preferred pharmacy.

## 2018-06-06 IMAGING — DX DG KNEE COMPLETE 4+V*R*
4 series · 4 of 4 positions shown · non-contrast
Comparison: None

CLINICAL DATA: Chronic right knee no injury

EXAM:
LEFT KNEE - 1-2 VIEW; RIGHT KNEE - COMPLETE 4+ VIEW

[knee ap]
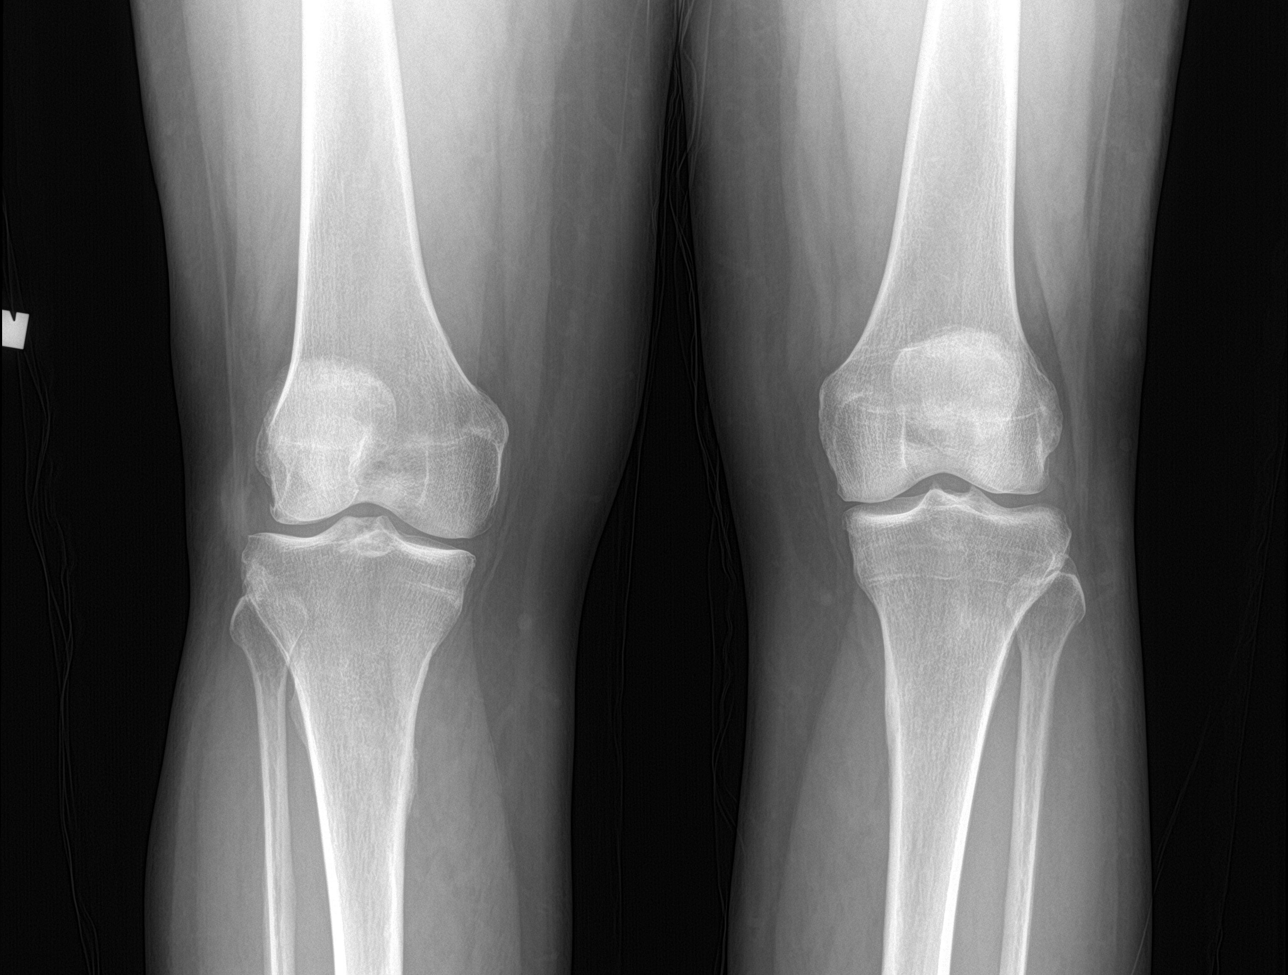

[tunnel]
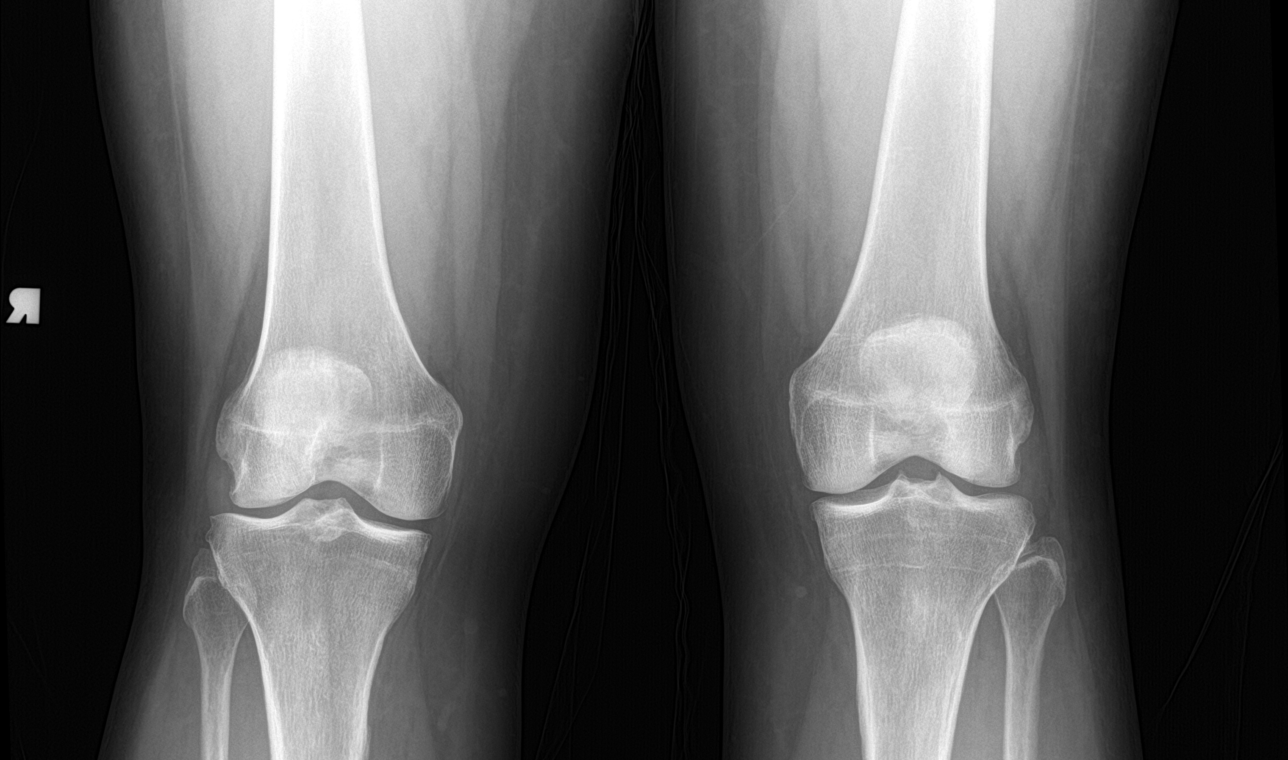

[knee lat]
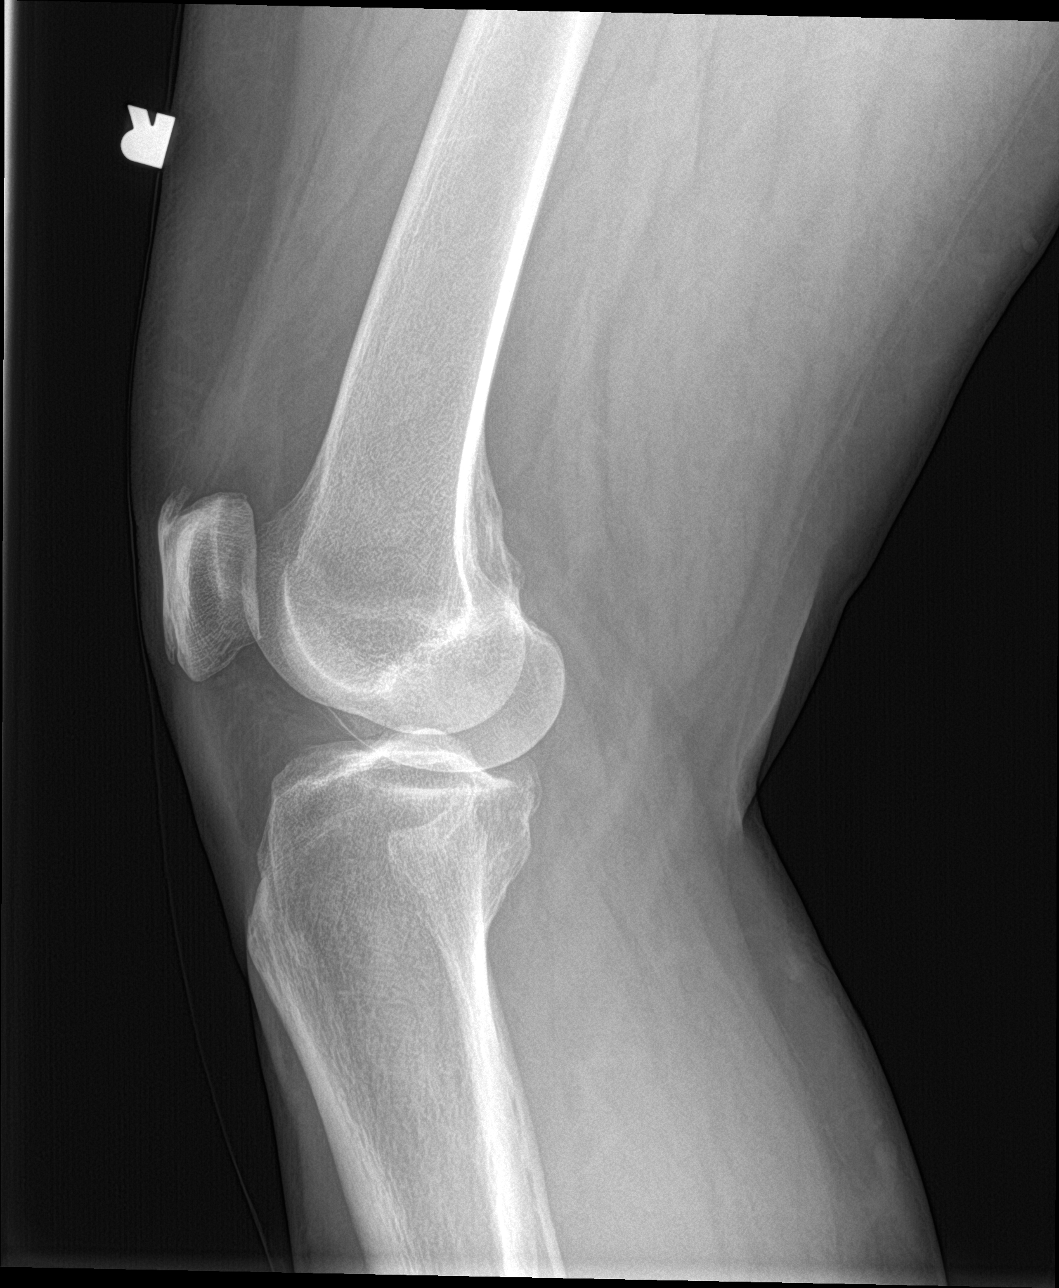

[knee sunrise]
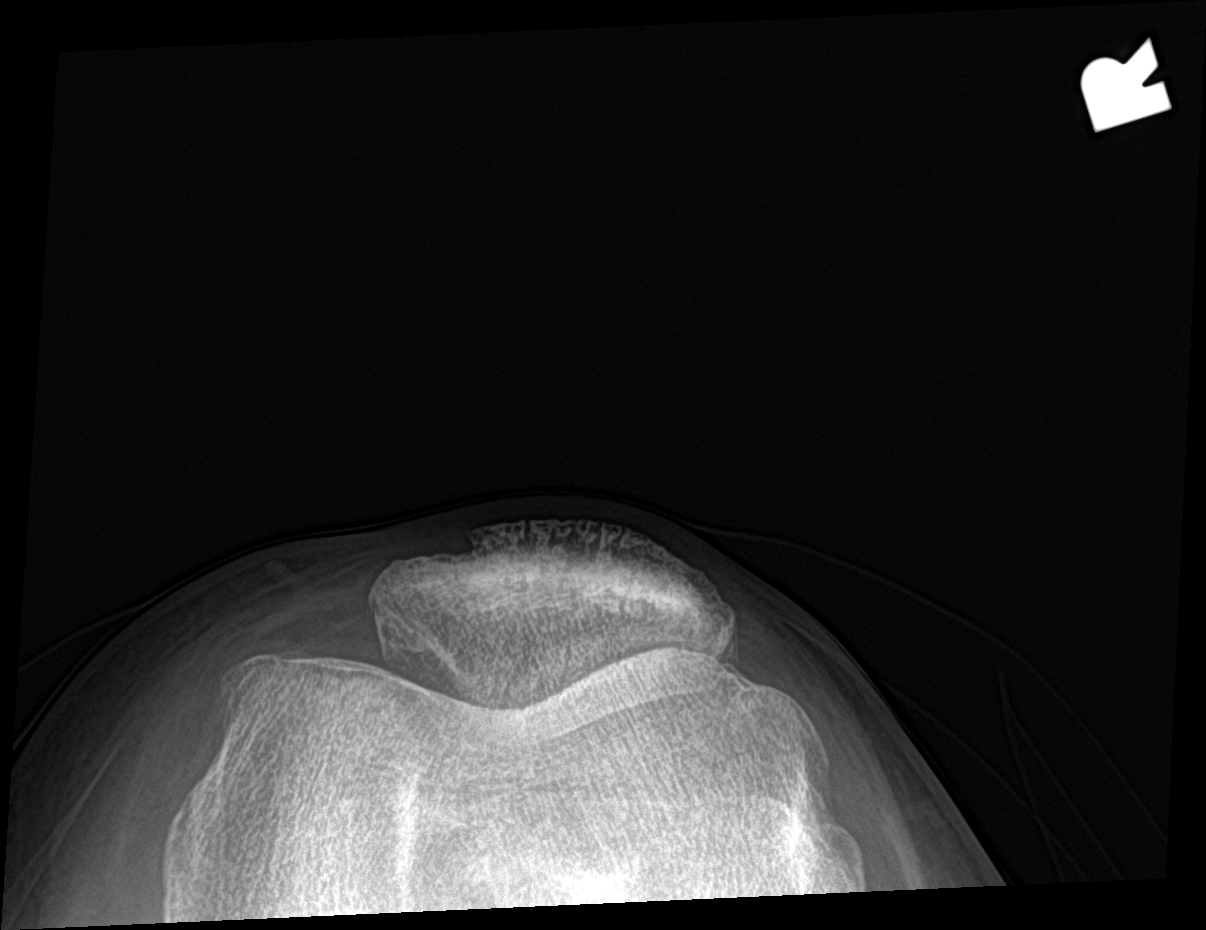

[4 of 4 positions shown; findings below may reference images not displayed]

FINDINGS: RIGHT knee:

Osseous demineralization.

Mild diffuse joint space narrowing.

Small patellar spurs at quadriceps and patellar tendon insertions.

No acute fracture, dislocation or bone destruction.

No knee joint effusion.

LEFT knee:

Joint space narrowing with minimal medial compartment spur
formation.

No acute fracture, dislocation or bone destruction grossly evident
on single AP view.
IMPRESSION: Mild degenerative changes of the knees.
